# Patient Record
Sex: Male | Born: 2000 | Race: White | Hispanic: No | Marital: Single | Smoking: Current every day smoker
Health system: Southern US, Community
[De-identification: ages and names within clinical notes are randomized; demographics above are authoritative.]

## PROBLEM LIST (undated history)

## (undated) HISTORY — PX: TONSILLECTOMY: SUR1361

---
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
## 2021-06-12 ENCOUNTER — Emergency Department
Admission: EM | Admit: 2021-06-12 | Discharge: 2021-06-12 | Disposition: A | Discharge status: Home / Self Care | Payer: Medicaid Other | Attending: Student in an Organized Health Care Education/Training Program | Admitting: Student in an Organized Health Care Education/Training Program

## 2021-06-12 ENCOUNTER — Emergency Department: Payer: Medicaid Other

## 2021-06-12 DIAGNOSIS — M7662 Achilles tendinitis, left leg: Secondary | ICD-10-CM | POA: Diagnosis not present

## 2021-06-12 DIAGNOSIS — M79672 Pain in left foot: Secondary | ICD-10-CM | POA: Diagnosis present

## 2021-06-12 MED ORDER — MELOXICAM 15 MG PO TABS: 15.0000 mg | ORAL_TABLET | Freq: Every day | ORAL | 0 refills | Status: AC

## 2021-06-12 MED ORDER — KETOROLAC TROMETHAMINE 30 MG/ML IJ SOLN
30.0000 mg | Freq: Once | INTRAMUSCULAR | Status: AC
  Administered 2021-06-12: 30 mg via INTRAMUSCULAR
  Filled 2021-06-12: qty 1

## 2021-06-12 NOTE — ED Triage Notes (Signed)
1 week left foot pain and per pt swelling

## 2021-06-12 NOTE — ED Notes (Signed)
Pt in calm. NADN

## 2021-06-12 NOTE — ED Provider Notes (Signed)
[facility] Emergency Department Provider Note  ____________________________________________  Time seen: Approximately 8:35 PM  I have reviewed the triage vital signs and the nursing notes.   HISTORY  Chief Complaint Foot Pain (Left pain x 1 week )    HPI James Suarez is a 20 y.o. male who presents the emergency department complaining of left heel/ankle pain.  Patient is having a burning/aching/throbbing sensation in his left posterior heel.  Patient stands for a lengthy period of time at his job.  He is constantly bending over.  Patient with no acute injury reported to the area.  No other injury or complaint.  There is no pain, edema or erythema of the calf.       History reviewed. No pertinent past medical history.  There are no problems to display for this patient.   Past Surgical History:  Procedure Laterality Date   TONSILLECTOMY      Prior to Admission medications   Medication Sig Start Date End Date Taking? Authorizing Provider  meloxicam (MOBIC) 15 MG tablet Take 1 tablet (15 mg total) by mouth daily. 06/12/21  Yes Espiridion Supinski, Delorise Royals, PA-C    Allergies Patient has no known allergies.  No family history on file.  Social History Social History   Tobacco Use   Smoking status: Passive Smoke Exposure - Never Smoker  Substance Use Topics   Alcohol use: No     Review of Systems  Constitutional: No fever/chills Eyes: No visual changes. No discharge ENT: No upper respiratory complaints. Cardiovascular: no chest pain. Respiratory: no cough. No SOB. Gastrointestinal: No abdominal pain.  No nausea, no vomiting.  No diarrhea.  No constipation. Musculoskeletal: Left heel pain Skin: Negative for rash, abrasions, lacerations, ecchymosis. Neurological: Negative for headaches, focal weakness or numbness.  10 System ROS otherwise negative.  ____________________________________________   PHYSICAL EXAM:  VITAL SIGNS: ED Triage  Vitals  Enc Vitals Group     BP 06/12/21 1900 (!) 148/75     Pulse Rate 06/12/21 1900 71     Resp 06/12/21 1900 17     Temp 06/12/21 1900 99.1 F (37.3 C)     Temp Source 06/12/21 1900 Oral     SpO2 06/12/21 1900 98 %     Weight 06/12/21 1901 154 lb 5.2 oz (70 kg)     Height 06/12/21 1901 5\' 8"  (1.727 m)     Head Circumference --      Peak Flow --      Pain Score 06/12/21 1901 8     Pain Loc --      Pain Edu? --      Excl. in GC? --      Constitutional: Alert and oriented. Well appearing and in no acute distress. Eyes: Conjunctivae are normal. PERRL. EOMI. Head: Atraumatic. ENT:      Ears:       Nose: No congestion/rhinnorhea.      Mouth/Throat: Mucous membranes are moist.  Neck: No stridor.    Cardiovascular: Normal rate, regular rhythm. Normal S1 and S2.  Good peripheral circulation. Respiratory: Normal respiratory effort without tachypnea or retractions. Lungs CTAB. Good air entry to the bases with no decreased or absent breath sounds. Musculoskeletal: Full range of motion to all extremities. No gross deformities appreciated.  Patient tender to palpation over bilateral posterior heels.  This is next to the insertion site for the Achilles tendon.  There is no tenderness over the tendon and no deficits.  No gross tenderness anywhere else  over the foot or ankle. Neurologic:  Normal speech and language. No gross focal neurologic deficits are appreciated.  Skin:  Skin is warm, dry and intact. No rash noted. Psychiatric: Mood and affect are normal. Speech and behavior are normal. Patient exhibits appropriate insight and judgement.   ____________________________________________   LABS (all labs ordered are listed, but only abnormal results are displayed)  Labs Reviewed - No data to display ____________________________________________  EKG   ____________________________________________  RADIOLOGY I personally viewed and evaluated these images as part of my medical  decision making, as well as reviewing the written report by the radiologist.  ED Provider Interpretation: No acute findings on x-ray  DG Foot Complete Left  Result Date: 06/12/2021 CLINICAL DATA:  Left heel pain for 1 week EXAM: LEFT FOOT - COMPLETE 3+ VIEW COMPARISON:  None. FINDINGS: Frontal, oblique, and lateral views of the left foot are obtained. No fracture, subluxation, or dislocation. Joint spaces are well preserved. Soft tissues are normal. IMPRESSION: 1. Unremarkable left foot. Electronically Signed   By: Sharlet Salina M.D.   On: 06/12/2021 21:07    ____________________________________________    PROCEDURES  Procedure(s) performed:    Procedures    Medications  ketorolac (TORADOL) 30 MG/ML injection 30 mg (30 mg Intramuscular Given 06/12/21 2156)     ____________________________________________   INITIAL IMPRESSION / ASSESSMENT AND PLAN / ED COURSE  Pertinent labs & imaging results that were available during my care of the patient were reviewed by me and considered in my medical decision making (see chart for details).  Review of the [place] CSRS was performed in accordance of the [facility] prior to dispensing any controlled drugs.           Patient's diagnosis is consistent with Achilles tendinitis.  Patient presents emergency department posterior heel pain.  He spends long periods of time standing on his feet.  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  X-ray was reassuring with no acute findings.  Findings on physical exam are most consistent with tendinitis.  Patient will have ASO lace up brace for symptom improvement.  Toradol tonight and meloxicam for additional symptom control.  Follow-up with orthopedics as needed. Patient is given ED precautions to return to the ED for any worsening or new symptoms.     ____________________________________________  FINAL CLINICAL IMPRESSION(S) / ED DIAGNOSES  Final diagnoses:  Achilles  tendinitis of left lower extremity      NEW MEDICATIONS STARTED DURING THIS VISIT:  ED Discharge Orders          Ordered    meloxicam (MOBIC) 15 MG tablet  Daily        06/12/21 2155                This chart was dictated using voice recognition software/Dragon. Despite best efforts to proofread, errors can occur which can change the meaning. Any change was purely unintentional.    Lanette Hampshire 06/12/21 2158    Willy Eddy, MD 06/12/21 2337

## 2022-02-06 ENCOUNTER — Other Ambulatory Visit: Payer: Self-pay

## 2022-02-06 ENCOUNTER — Emergency Department
Admission: EM | Admit: 2022-02-06 | Discharge: 2022-02-06 | Disposition: A | Discharge status: Home / Self Care | Payer: Worker's Compensation | Attending: Emergency Medicine | Admitting: Emergency Medicine

## 2022-02-06 DIAGNOSIS — S61211A Laceration without foreign body of left index finger without damage to nail, initial encounter: Secondary | ICD-10-CM | POA: Diagnosis not present

## 2022-02-06 DIAGNOSIS — W268XXA Contact with other sharp object(s), not elsewhere classified, initial encounter: Secondary | ICD-10-CM | POA: Insufficient documentation

## 2022-02-06 DIAGNOSIS — Z23 Encounter for immunization: Secondary | ICD-10-CM | POA: Diagnosis not present

## 2022-02-06 DIAGNOSIS — S6992XA Unspecified injury of left wrist, hand and finger(s), initial encounter: Secondary | ICD-10-CM | POA: Diagnosis present

## 2022-02-06 DIAGNOSIS — Y990 Civilian activity done for income or pay: Secondary | ICD-10-CM | POA: Diagnosis not present

## 2022-02-06 MED ORDER — LIDOCAINE HCL (PF) 1 % IJ SOLN
5.0000 mL | Freq: Once | INTRAMUSCULAR | Status: AC
  Administered 2022-02-06: 5 mL
  Filled 2022-02-06: qty 5

## 2022-02-06 MED ORDER — TETANUS-DIPHTH-ACELL PERTUSSIS 5-2.5-18.5 LF-MCG/0.5 IM SUSY
0.5000 mL | PREFILLED_SYRINGE | Freq: Once | INTRAMUSCULAR | Status: AC
  Administered 2022-02-06: 0.5 mL via INTRAMUSCULAR

## 2022-02-06 MED ORDER — CEPHALEXIN 500 MG PO CAPS: 500.0000 mg | ORAL_CAPSULE | Freq: Two times a day (BID) | ORAL | 0 refills | Status: AC

## 2022-02-06 NOTE — ED Provider Notes (Signed)
? ?[facility] ?Provider Note ? ? ? Event Date/Time  ? First MD Initiated Contact with Patient 02/06/22 [phone]   ?  (approximate) ? ? ?History  ? ?Laceration ? ? ?HPI ? ?James Suarez is a 21 y.o. male  \otherwise healthy who comes in with laceration to the hand.  Patient reports being at work when he cut it on a piece of metal at work today.  He reports that the metal was very sharp and there is no chance that anything broke off into the wound.  He does report washing out thoroughly before coming in.  He reports the cut is on his left hand below his index finger.  He reports normal sensation and normal movements of his finger.  He is unclear of his last tetanus shot. ? ?  ? ? ?Physical Exam  ? ?Triage Vital Signs: ?ED Triage Vitals  ?Enc Vitals Group  ?   BP 02/06/22 0740 134/77  ?   Pulse Rate 02/06/22 0740 (!) 58  ?   Resp 02/06/22 0740 16  ?   Temp 02/06/22 0740 98 ?F (36.7 ?C)  ?   Temp Source 02/06/22 0740 Oral  ?   SpO2 02/06/22 0740 97 %  ?   Weight 02/06/22 0741 150 lb (68 kg)  ?   Height 02/06/22 0741 5\' 8"  (1.727 m)  ?   Head Circumference --   ?   Peak Flow --   ?   Pain Score 02/06/22 0741 3  ?   Pain Loc --   ?   Pain Edu? --   ?   Excl. in GC? --   ? ? ?Most recent vital signs: ?Vitals:  ? 02/06/22 0740  ?BP: 134/77  ?Pulse: (!) 58  ?Resp: 16  ?Temp: 98 ?F (36.7 ?C)  ?SpO2: 97%  ? ? ? ?General: Awake, no distress.  ?CV:  Good peripheral perfusion.  ?Resp:  Normal effort.  ?Abd:  No distention.  ?Other:  Patient has about a 1 to 2 cm superficial laceration noted to the base of the second digit.  No exposed tendon.  Sensation intact normal cap refill.  Good radial pulse.  Flexion preserved at DIP and PIP joint. ? ? ?ED Results / Procedures / Treatments  ? ? ? ? ? ?04/08/22.Laceration Repair ? ?Date/Time: 02/06/2022 8:48 AM ?Performed by: 04/08/2022, MD ?Authorized by: Concha Se, MD  ? ?Consent:  ?  Consent obtained:  Verbal ?  Consent given by:  Patient ?  Risks discussed:   Infection, pain and retained foreign body ?  Alternatives discussed:  No treatment ?Universal protocol:  ?  Patient identity confirmed:  Verbally with patient ?Anesthesia:  ?  Anesthesia method:  Local infiltration ?  Local anesthetic:  Lidocaine 1% w/o epi ?Laceration details:  ?  Location:  Finger ?  Finger location:  L index finger ?  Length (cm):  2 ?  Depth (mm):  1 ?Pre-procedure details:  ?  Preparation:  Patient was prepped and draped in usual sterile fashion ?Exploration:  ?  Hemostasis achieved with:  Direct pressure ?  Imaging outcome: foreign body not noted   ?  Wound exploration: wound explored through full range of motion and entire depth of wound visualized   ?  Wound extent: no foreign bodies/material noted, no nerve damage noted, no tendon damage noted, no underlying fracture noted and no vascular damage noted   ?  Contaminated: no   ?Treatment:  ?  Area cleansed with:  Povidone-iodine ?  Amount of cleaning:  Standard ?  Irrigation volume:  20cc ?  Irrigation method:  Pressure wash ?  Visualized foreign bodies/material removed: yes   ?Skin repair:  ?  Repair method:  Sutures ?  Suture size:  6-0 ?  Suture material:  Prolene ?  Number of sutures:  3 ?Approximation:  ?  Approximation:  Close ?Repair type:  ?  Repair type:  Simple ?Post-procedure details:  ?  Dressing: baindage. ?  Procedure completion:  Tolerated well, no immediate complications ? ? ?MEDICATIONS ORDERED IN ED: ?Medications  ?lidocaine (PF) (XYLOCAINE) 1 % injection 5 mL (has no administration in time range)  ? ? ? ?IMPRESSION / MDM / ASSESSMENT AND PLAN / ED COURSE  ?I reviewed the triage vital signs and the nursing notes. ? ? ?Patient comes in with superficial laceration to the finger.  No evidence of tendon or vascular injury.  No evidence of foreign body was a slice no pressure to suggest any fracture.  Very low suspicion for fracture, dislocation given exam and H&P.  Therefore we will hold off on x-ray.  Wound was explored and it  was pretty superficial in nature.  Tdap was updated.  Patient was given a course of Keflex and sutures placed see procedure note ? ?I reviewed patient's orthopedic note from 11/05/2019 when patient was seen for a close dislocation of his patella ? ? ? ? ?FINAL CLINICAL IMPRESSION(S) / ED DIAGNOSES  ? ?Final diagnoses:  ?None  ? ? ? ?Rx / DC Orders  ? ?ED Discharge Orders   ? ? None  ? ?  ? ? ? ?Note:  This document was prepared using Dragon voice recognition software and may include unintentional dictation errors. ?  ?Concha Se, MD ?02/06/22 [phone] ? ?

## 2022-02-06 NOTE — ED Triage Notes (Signed)
Pt states he cut his left hand on a piece of metal at work today, bandage in place on arrival. Bleeding controlled ?

## 2022-02-06 NOTE — Discharge Instructions (Signed)
Take the antibiotics to help prevent infection.  Have the sutures look out at day 7 to see if they can be removed or you go to urgent care or come back here.  You can take Tylenol 1 g every 8 hours or ibuprofen 600 every 6-8 hours with food to help with pain ?

## 2022-02-15 ENCOUNTER — Ambulatory Visit
Admission: EM | Admit: 2022-02-15 | Discharge: 2022-02-15 | Disposition: A | Discharge status: Home / Self Care | Payer: Medicaid Other

## 2022-02-15 DIAGNOSIS — Z4802 Encounter for removal of sutures: Secondary | ICD-10-CM | POA: Diagnosis not present

## 2022-02-15 DIAGNOSIS — S61211D Laceration without foreign body of left index finger without damage to nail, subsequent encounter: Secondary | ICD-10-CM

## 2022-02-15 NOTE — ED Triage Notes (Signed)
Patient is here for "Suture removal". No concerns. No redness. No drainage. No fever.

## 2022-09-12 ENCOUNTER — Ambulatory Visit
Admission: EM | Admit: 2022-09-12 | Discharge: 2022-09-12 | Discharge status: Against Medical Advice | Payer: Medicaid Other
